# Patient Record
Sex: Female | Born: 1940 | Race: White | Hispanic: No | Marital: Married | Smoking: Former smoker
Health system: Southern US, Community
[De-identification: ages and names within clinical notes are randomized; demographics above are authoritative.]

## PROBLEM LIST (undated history)

## (undated) DIAGNOSIS — R0600 Dyspnea, unspecified: Secondary | ICD-10-CM

## (undated) DIAGNOSIS — I10 Essential (primary) hypertension: Secondary | ICD-10-CM

## (undated) DIAGNOSIS — G4730 Sleep apnea, unspecified: Secondary | ICD-10-CM

## (undated) DIAGNOSIS — M1990 Unspecified osteoarthritis, unspecified site: Secondary | ICD-10-CM

---

## 1999-07-06 ENCOUNTER — Other Ambulatory Visit: Admission: RE | Admit: 1999-07-06 | Discharge: 1999-07-06 | Payer: Self-pay | Admitting: Family Medicine

## 2001-01-10 ENCOUNTER — Other Ambulatory Visit: Admission: RE | Admit: 2001-01-10 | Discharge: 2001-01-10 | Payer: Self-pay | Admitting: Family Medicine

## 2002-11-27 ENCOUNTER — Other Ambulatory Visit: Admission: RE | Admit: 2002-11-27 | Discharge: 2002-11-27 | Payer: Self-pay | Admitting: Family Medicine

## 2004-05-26 ENCOUNTER — Other Ambulatory Visit: Admission: RE | Admit: 2004-05-26 | Discharge: 2004-05-26 | Payer: Self-pay | Admitting: Family Medicine

## 2004-05-26 ENCOUNTER — Ambulatory Visit: Payer: Self-pay | Admitting: Family Medicine

## 2004-06-28 ENCOUNTER — Ambulatory Visit: Payer: Self-pay | Admitting: Family Medicine

## 2004-07-11 ENCOUNTER — Ambulatory Visit: Payer: Self-pay | Admitting: Family Medicine

## 2004-09-20 ENCOUNTER — Ambulatory Visit: Payer: Self-pay | Admitting: Family Medicine

## 2012-10-21 ENCOUNTER — Encounter: Payer: Self-pay | Admitting: Internal Medicine

## 2013-05-30 ENCOUNTER — Encounter: Payer: Self-pay | Admitting: Internal Medicine

## 2019-07-22 ENCOUNTER — Other Ambulatory Visit (HOSPITAL_COMMUNITY): Payer: Self-pay | Admitting: Surgery

## 2019-07-22 ENCOUNTER — Other Ambulatory Visit: Payer: Self-pay | Admitting: Surgery

## 2019-07-22 DIAGNOSIS — E210 Primary hyperparathyroidism: Secondary | ICD-10-CM

## 2019-07-25 ENCOUNTER — Other Ambulatory Visit: Payer: Self-pay | Admitting: Surgery

## 2019-07-25 ENCOUNTER — Other Ambulatory Visit (HOSPITAL_COMMUNITY): Payer: Self-pay | Admitting: Surgery

## 2019-07-25 DIAGNOSIS — E210 Primary hyperparathyroidism: Secondary | ICD-10-CM

## 2019-08-08 ENCOUNTER — Encounter (HOSPITAL_COMMUNITY)
Admission: RE | Admit: 2019-08-08 | Discharge: 2019-08-08 | Disposition: A | Discharge status: Home / Self Care | Payer: Medicare Other | Source: Ambulatory Visit | Attending: Surgery | Admitting: Surgery

## 2019-08-08 ENCOUNTER — Ambulatory Visit (HOSPITAL_COMMUNITY)
Admission: RE | Admit: 2019-08-08 | Discharge: 2019-08-08 | Disposition: A | Discharge status: Home / Self Care | Payer: Medicare Other | Source: Ambulatory Visit | Attending: Surgery | Admitting: Surgery

## 2019-08-08 ENCOUNTER — Other Ambulatory Visit: Payer: Self-pay

## 2019-08-08 DIAGNOSIS — E210 Primary hyperparathyroidism: Secondary | ICD-10-CM | POA: Diagnosis present

## 2019-08-08 MED ORDER — TECHNETIUM TC 99M SESTAMIBI - CARDIOLITE: 24.8000 | Freq: Once | INTRAVENOUS | Status: DC | PRN

## 2019-09-19 ENCOUNTER — Other Ambulatory Visit: Payer: Self-pay | Admitting: Surgery

## 2019-09-19 DIAGNOSIS — E210 Primary hyperparathyroidism: Secondary | ICD-10-CM

## 2019-10-03 ENCOUNTER — Ambulatory Visit
Admission: RE | Admit: 2019-10-03 | Discharge: 2019-10-03 | Disposition: A | Discharge status: Home / Self Care | Payer: Medicare Other | Source: Ambulatory Visit | Attending: Surgery | Admitting: Surgery

## 2019-10-03 DIAGNOSIS — E210 Primary hyperparathyroidism: Secondary | ICD-10-CM

## 2019-10-03 MED ORDER — IOPAMIDOL (ISOVUE-300) INJECTION 61%
75.0000 mL | Freq: Once | INTRAVENOUS | Status: AC | PRN
  Administered 2019-10-03: 75 mL via INTRAVENOUS

## 2019-10-09 ENCOUNTER — Ambulatory Visit: Payer: Self-pay | Admitting: Surgery

## 2019-11-24 ENCOUNTER — Encounter: Payer: Self-pay | Admitting: Surgery

## 2019-11-24 DIAGNOSIS — E210 Primary hyperparathyroidism: Secondary | ICD-10-CM | POA: Diagnosis present

## 2019-11-24 NOTE — H&P (Signed)
General Surgery [facility]  Destiny Patterson Reinig DOB: 1940/11/06 Married / Language: English / Race: White Female   History of Present Illness   The patient is a 79 year old female who presents with primary hyperparathyroidism.  CHIEF COMPLAINT: primary hyperparathyroidism  Patient is referred by Dr. Ginger Organ from [facility] family physicians in [place], [place]. Patient is referred for primary hyperparathyroidism. Patient now lives at [facility]. She previously lived here in [place]. Her evaluation for primary hyperparathyroidism and for thyroid nodules has been performed at [facility] in [facility] as well as at medical [facility] in [place], [place]. Patient brings a variety of records today for review. Patient has likely had hypercalcemia dating back almost 10 years. Most recent laboratory studies show calcium levels ranging from 10.7-10.8 and an intact PTH level measuring from 86-124. Patient notes chronic fatigue. She does have bone and joint discomfort. She has osteoporosis by bone density scanning. She denies nephrolithiasis. She has not had any recent fractures. Patient has had no prior head or neck surgery. Part of her evaluation included thyroid ultrasound for which she underwent fine-needle aspiration biopsies at other facilities. No sign of malignancy was identified. There is no family history of endocrine neoplasm or malignancy. Patient presents today accompanied by her husband for further evaluation and recommendations.   Past Surgical History  Tonsillectomy   Diagnostic Studies History  Colonoscopy  1-5 years ago Mammogram  1-3 years ago  Allergies No Known Drug Allergies  Medication History  amLODIPine Besylate (5MG  Tablet, Oral) Active. hydroCHLOROthiazide (12.5MG  Capsule, Oral) Active. Valsartan (160MG  Tablet, Oral) Active. Calcium Carbonate-Vitamin  D (500-125MG -UNIT Tablet, Oral) Active. Medications Reconciled  Social History Alcohol use  Occasional alcohol use. Caffeine use  Carbonated beverages, Coffee. Tobacco use  Former smoker.  Family History Arthritis  Father, Mother. Hypertension  Mother. Migraine Headache  Daughter, Mother. Thyroid problems  Sister.  Pregnancy / Birth History  Age at menarche  14 years. Age of men[age]  [age] Contraceptive History  Oral contraceptives. Gravida  2  Other Problems  Arthritis  High blood pressure  Sleep Apnea   Review of Systems General Present- Fatigue. Not Present- Appetite Loss, Chills, Fever, Night Sweats, Weight Gain and Weight Loss. Skin Present- Change in Wart/Mole and Dryness. Not Present- Hives, Jaundice, New Lesions, Non-Healing Wounds, Rash and Ulcer. HEENT Present- Seasonal Allergies. Not Present- Earache, Hearing Loss, Hoarseness, Nose Bleed, Oral Ulcers, Ringing in the Ears, Sinus Pain, Sore Throat, Visual Disturbances, Wears glasses/contact lenses and Yellow Eyes.  Vitals  Weight: 214.4 lb Height: 64in Body Surface Area: 2.01 m Body Mass Index: 36.8 kg/m  Temp.: 98.30F  Pulse: 105 (Regular)  BP: 154/84(Sitting, Left Arm, Standard)  Physical Exam  GENERAL APPEARANCE Development: normal Nutritional status: normal Gross deformities: none  SKIN Rash, lesions, ulcers: none Induration, erythema: none Nodules: none palpable  EYES Conjunctiva and lids: normal Pupils: equal and reactive Iris: normal bilaterally  EARS, NOSE, MOUTH, THROAT External ears: no lesion or deformity External nose: no lesion or deformity Hearing: grossly normal Due to Covid-19 pandemic, patient is wearing a mask.  NECK Symmetric: yes Trachea: midline Thyroid: no palpable nodules in the thyroid bed  CHEST Respiratory effort: normal Retraction or accessory muscle use: no Breath sounds: normal bilaterally Rales, rhonchi, wheeze:  none  CARDIOVASCULAR Auscultation: regular rhythm, normal rate Murmurs: none Pulses: radial pulse 2+ palpable Lower extremity edema: Mild bilateral ankles, greater on the left than on the right  MUSCULOSKELETAL Station and gait: normal Digits and nails: no clubbing or cyanosis Muscle strength: grossly normal all extremities Range of motion: grossly normal all extremities Deformity: none  LYMPHATIC Cervical: none palpable Supraclavicular: none palpable  PSYCHIATRIC Oriented to person, place, and time: yes Mood and affect: normal for situation Judgment and insight: appropriate for situation    Assessment & Plan   PRIMARY HYPERPARATHYROIDISM (E21.0) OSTEOPENIA (M85.80)  Patient presents today on referral from her primary care physician, Dr. Ginger Organ, in [place], [place]. She is accompanied by her husband.  Patient provided with a copy of "Parathyroid Surgery: Treatment for Your Parathyroid Gland Problem", published by [employer], 12 pages. Book reviewed and explained to patient during visit today.  Patient has biochemical evidence of primary hyperparathyroidism. She also has a history of thyroid nodules and has undergone previous fine-needle aspiration biopsies. After discussion, I would like to repeat the patient's imaging studies here with our radiologist. I would like to have an ultrasound of the neck performed as well as a nuclear medicine parathyroid scan. Likely these studies will confirm her diagnosis and hopefully provide [place] with a guide for planning her surgery. She most likely has single gland disease and may be a good candidate for minimally invasive outpatient parathyroid surgery. Likely her thyroid nodules will be of the type that continued to be monitored with ultrasound evaluation. If the above studies do not resolve the issue with her parathyroid disease, then we will consider proceeding with a 4D CT scan of the neck.  Patient was undergoing the above  studies. We will contact her with those results and make plans for further management at that time.  ADDENDUM  Telephone call to patient to discuss the results of her CT scan of the neck performed on October 06, 2019. 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  The patient and I discussed minimally invasive parathyroidectomy. This would be an outpatient surgical procedure. We discussed the hospital stay and her recovery. She understands and wishes to proceed in the near future.  The risks and benefits of the procedure have been discussed at length with the patient. The patient understands the proposed procedure, potential alternative treatments, and the course of recovery to be expected. All of the patient's questions have been answered at this time. The patient wishes to proceed with surgery.  Darnell Level, MD [facility] Office: [phone]

## 2019-11-26 NOTE — Patient Instructions (Addendum)
DUE TO COVID-19 ONLY ONE VISITOR IS ALLOWED TO COME WITH YOU AND STAY IN THE WAITING ROOM ONLY DURING PRE OP AND PROCEDURE DAY OF SURGERY. THE 1 VISITOR  MAY VISIT WITH YOU AFTER SURGERY IN YOUR PRIVATE ROOM DURING VISITING HOURS ONLY!  YOU NEED TO HAVE A COVID 19 TEST ON: 11/27/19 ,THIS TEST MUST BE DONE BEFORE SURGERY,  COVID TESTING SITE [address] [place] [address], IT IS ON THE RIGHT GOING OUT [address] APPROXIMATELY  2 MINUTES PAST [employer] ON THE RIGHT. ONCE YOUR COVID TEST IS COMPLETED,  PLEASE BEGIN THE QUARANTINE INSTRUCTIONS AS OUTLINED IN YOUR HANDOUT.                Destiny Patterson    Your procedure is scheduled on: 12/01/19   Report to [facility] Main  Entrance   Report to admitting at: 8:15 AM     Call this number if you have problems the morning of surgery [phone]    Remember: Do not eat food or drink liquids :After Midnight.   BRUSH YOUR TEETH MORNING OF SURGERY AND RINSE YOUR MOUTH OUT, NO CHEWING GUM CANDY OR MINTS.     Take these medicines the morning of surgery with A SIP OF WATER: amlodipine.                                You may not have any metal on your body including hair pins and              piercings  Do not wear jewelry, make-up, lotions, powders or perfumes, deodorant             Do not wear nail polish on your fingernails.  Do not shave  48 hours prior to surgery.    Do not bring valuables to the hospital. [facility] IS NOT             RESPONSIBLE   FOR VALUABLES.  Contacts, dentures or bridgework may not be worn into surgery.  Leave suitcase in the car. After surgery it may be brought to your room.     Patients discharged the day of surgery will not be allowed to drive home. IF YOU ARE HAVING SURGERY AND GOING HOME THE SAME DAY, YOU MUST HAVE AN ADULT TO DRIVE YOU HOME AND BE WITH YOU FOR 24 HOURS. YOU MAY GO HOME BY TAXI OR UBER OR ORTHERWISE, BUT AN ADULT MUST ACCOMPANY YOU HOME AND STAY WITH YOU FOR 24  HOURS.  Name and phone number of your driver:  Special Instructions: N/A              Please read over the following fact sheets you were given: _____________________________________________________________________       PLEASE BRING CPAP MASK AND  TUBING ONLY. DEVICE WILL BE PROVIDED!  [facility] - Preparing for Surgery Before surgery, you can play an important role.  Because skin is not sterile, your skin needs to be as free of germs as possible.  You can reduce the number of germs on your skin by washing with CHG (chlorahexidine gluconate) soap before surgery.  CHG is an antiseptic cleaner which kills germs and bonds with the skin to continue killing germs even after washing. Please DO NOT use if you have an allergy to CHG or antibacterial soaps.  If your skin becomes reddened/irritated stop using the CHG and inform your  nurse when you arrive at Short Stay. Do not shave (including legs and underarms) for at least 48 hours prior to the first CHG shower.  You may shave your face/neck. Please follow these instructions carefully:  1.  Shower with CHG Soap the night before surgery and the  morning of Surgery.  2.  If you choose to wash your hair, wash your hair first as usual with your  normal  shampoo.  3.  After you shampoo, rinse your hair and body thoroughly to remove the  shampoo.                           4.  Use CHG as you would any other liquid soap.  You can apply chg directly  to the skin and wash                       Gently with a scrungie or clean washcloth.  5.  Apply the CHG Soap to your body ONLY FROM THE NECK DOWN.   Do not use on face/ open                           Wound or open sores. Avoid contact with eyes, ears mouth and genitals (private parts).                       Wash face,  Genitals (private parts) with your normal soap.             6.  Wash thoroughly, paying special attention to the area where your surgery  will be performed.  7.  Thoroughly rinse your body with  warm water from the neck down.  8.  DO NOT shower/wash with your normal soap after using and rinsing off  the CHG Soap.                9.  Pat yourself dry with a clean towel.            10.  Wear clean pajamas.            11.  Place clean sheets on your bed the night of your first shower and do not  sleep with pets. Day of Surgery : Do not apply any lotions/deodorants the morning of surgery.  Please wear clean clothes to the hospital/surgery center.  FAILURE TO FOLLOW THESE INSTRUCTIONS MAY RESULT IN THE CANCELLATION OF YOUR SURGERY PATIENT SIGNATURE_________________________________  NURSE SIGNATURE__________________________________  ________________________________________________________________________

## 2019-11-27 ENCOUNTER — Encounter (HOSPITAL_COMMUNITY)
Admission: RE | Admit: 2019-11-27 | Discharge: 2019-11-27 | Disposition: A | Discharge status: Home / Self Care | Payer: Medicare Other | Source: Ambulatory Visit | Attending: Surgery | Admitting: Surgery

## 2019-11-27 ENCOUNTER — Other Ambulatory Visit: Payer: Self-pay

## 2019-11-27 ENCOUNTER — Encounter (HOSPITAL_COMMUNITY): Payer: Self-pay

## 2019-11-27 ENCOUNTER — Other Ambulatory Visit (HOSPITAL_COMMUNITY)
Admission: RE | Admit: 2019-11-27 | Discharge: 2019-11-27 | Disposition: A | Discharge status: Home / Self Care | Payer: Medicare Other | Source: Ambulatory Visit | Attending: Surgery | Admitting: Surgery

## 2019-11-27 DIAGNOSIS — Z20822 Contact with and (suspected) exposure to covid-19: Secondary | ICD-10-CM | POA: Insufficient documentation

## 2019-11-27 DIAGNOSIS — Z01810 Encounter for preprocedural cardiovascular examination: Secondary | ICD-10-CM | POA: Insufficient documentation

## 2019-11-27 DIAGNOSIS — Z01812 Encounter for preprocedural laboratory examination: Secondary | ICD-10-CM | POA: Insufficient documentation

## 2019-11-27 HISTORY — DX: Sleep apnea, unspecified: G47.30

## 2019-11-27 HISTORY — DX: Essential (primary) hypertension: I10

## 2019-11-27 HISTORY — DX: Dyspnea, unspecified: R06.00

## 2019-11-27 HISTORY — DX: Unspecified osteoarthritis, unspecified site: M19.90

## 2019-11-27 LAB — CBC
HCT: 41.2 % (ref 36.0–46.0)
Hemoglobin: 13.5 g/dL (ref 12.0–15.0)
MCH: 30.8 pg (ref 26.0–34.0)
MCHC: 32.8 g/dL (ref 30.0–36.0)
MCV: 94.1 fL (ref 80.0–100.0)
Platelets: 297 10*3/uL (ref 150–400)
RBC: 4.38 MIL/uL (ref 3.87–5.11)
RDW: 13.7 % (ref 11.5–15.5)
WBC: 7.7 10*3/uL (ref 4.0–10.5)
nRBC: 0 % (ref 0.0–0.2)

## 2019-11-27 LAB — BASIC METABOLIC PANEL
Anion gap: 10 (ref 5–15)
BUN: 19 mg/dL (ref 8–23)
CO2: 26 mmol/L (ref 22–32)
Calcium: 10.6 mg/dL — ABNORMAL HIGH (ref 8.9–10.3)
Chloride: 105 mmol/L (ref 98–111)
Creatinine, Ser: 0.82 mg/dL (ref 0.44–1.00)
GFR calc Af Amer: >60 mL/min (ref >60)
GFR calc non Af Amer: >60 mL/min (ref >60)
Glucose, Bld: 115 mg/dL — ABNORMAL HIGH (ref 70–99)
Potassium: 4.1 mmol/L (ref 3.5–5.1)
Sodium: 141 mmol/L (ref 135–145)

## 2019-11-27 LAB — SARS CORONAVIRUS 2 (TAT 6-24 HRS): SARS Coronavirus 2: NEGATIVE

## 2019-11-27 NOTE — Progress Notes (Signed)
COVID Vaccine Completed:Yes Date COVID Vaccine completed:05/23/19 COVID vaccine manufacturer:     Moderna    PCP - Dr. Donita Brooks. LOV: 07/28/19. Cardiologist - NO  Chest x-ray -  EKG -  Stress Test -  ECHO -  Cardiac Cath -  Pacemaker/ICD device last checked:  Sleep Study - Yes CPAP - Yes  Fasting Blood Sugar -  Checks Blood Sugar _____ times a day  Blood Thinner Instructions: Aspirin Instructions: Last Dose:  Anesthesia review:   Patient denies shortness of breath, fever, cough and chest pain at PAT appointment   Patient verbalized understanding of instructions that were given to them at the PAT appointment. Patient was also instructed that they will need to review over the PAT instructions again at home before surgery.

## 2019-12-01 ENCOUNTER — Ambulatory Visit (HOSPITAL_COMMUNITY)
Admission: RE | Admit: 2019-12-01 | Discharge: 2019-12-01 | Disposition: A | Discharge status: Home / Self Care | Payer: Medicare Other | Attending: Surgery | Admitting: Surgery

## 2019-12-01 ENCOUNTER — Ambulatory Visit (HOSPITAL_COMMUNITY): Payer: Medicare Other | Admitting: Anesthesiology

## 2019-12-01 ENCOUNTER — Encounter (HOSPITAL_COMMUNITY): Payer: Self-pay | Admitting: Surgery

## 2019-12-01 ENCOUNTER — Encounter (HOSPITAL_COMMUNITY)
Admission: RE | Disposition: A | Discharge status: Home / Self Care | Payer: Self-pay | Source: Home / Self Care | Attending: Surgery

## 2019-12-01 DIAGNOSIS — Z79899 Other long term (current) drug therapy: Secondary | ICD-10-CM | POA: Diagnosis not present

## 2019-12-01 DIAGNOSIS — I10 Essential (primary) hypertension: Secondary | ICD-10-CM | POA: Diagnosis not present

## 2019-12-01 DIAGNOSIS — E669 Obesity, unspecified: Secondary | ICD-10-CM | POA: Diagnosis not present

## 2019-12-01 DIAGNOSIS — Z87891 Personal history of nicotine dependence: Secondary | ICD-10-CM | POA: Insufficient documentation

## 2019-12-01 DIAGNOSIS — M1990 Unspecified osteoarthritis, unspecified site: Secondary | ICD-10-CM | POA: Insufficient documentation

## 2019-12-01 DIAGNOSIS — E210 Primary hyperparathyroidism: Secondary | ICD-10-CM | POA: Insufficient documentation

## 2019-12-01 DIAGNOSIS — G4730 Sleep apnea, unspecified: Secondary | ICD-10-CM | POA: Insufficient documentation

## 2019-12-01 DIAGNOSIS — Z6836 Body mass index (BMI) 36.0-36.9, adult: Secondary | ICD-10-CM | POA: Insufficient documentation

## 2019-12-01 HISTORY — PX: PARATHYROIDECTOMY: SHX19

## 2019-12-01 SURGERY — PARATHYROIDECTOMY
Anesthesia: General | Site: Neck

## 2019-12-01 MED ORDER — ONDANSETRON HCL 4 MG/2ML IJ SOLN
INTRAMUSCULAR | Status: AC
  Filled 2019-12-01: qty 2

## 2019-12-01 MED ORDER — CHLORHEXIDINE GLUCONATE CLOTH 2 % EX PADS: 6.0000 | MEDICATED_PAD | Freq: Once | CUTANEOUS | Status: DC

## 2019-12-01 MED ORDER — LIDOCAINE 2% (20 MG/ML) 5 ML SYRINGE
INTRAMUSCULAR | Status: DC | PRN
  Administered 2019-12-01: 80 mg via INTRAVENOUS

## 2019-12-01 MED ORDER — CHLORHEXIDINE GLUCONATE 0.12 % MT SOLN
15.0000 mL | Freq: Once | OROMUCOSAL | Status: AC
  Administered 2019-12-01: 15 mL via OROMUCOSAL

## 2019-12-01 MED ORDER — BUPIVACAINE HCL 0.25 % IJ SOLN
INTRAMUSCULAR | Status: AC
  Filled 2019-12-01: qty 1

## 2019-12-01 MED ORDER — OXYCODONE HCL 5 MG/5ML PO SOLN: 5.0000 mg | Freq: Once | ORAL | Status: DC | PRN

## 2019-12-01 MED ORDER — LACTATED RINGERS IV SOLN: INTRAVENOUS | Status: DC

## 2019-12-01 MED ORDER — EPHEDRINE SULFATE 50 MG/ML IJ SOLN
INTRAMUSCULAR | Status: DC | PRN
  Administered 2019-12-01 (×3): 10 mg via INTRAVENOUS

## 2019-12-01 MED ORDER — ROCURONIUM BROMIDE 10 MG/ML (PF) SYRINGE
PREFILLED_SYRINGE | INTRAVENOUS | Status: DC | PRN
  Administered 2019-12-01: 50 mg via INTRAVENOUS
  Administered 2019-12-01: 10 mg via INTRAVENOUS

## 2019-12-01 MED ORDER — ONDANSETRON HCL 4 MG/2ML IJ SOLN: 4.0000 mg | Freq: Once | INTRAMUSCULAR | Status: DC | PRN

## 2019-12-01 MED ORDER — BUPIVACAINE-EPINEPHRINE (PF) 0.25% -1:200000 IJ SOLN
INTRAMUSCULAR | Status: AC
  Filled 2019-12-01: qty 30

## 2019-12-01 MED ORDER — FENTANYL CITRATE (PF) 100 MCG/2ML IJ SOLN
INTRAMUSCULAR | Status: DC | PRN
Start: 2019-12-01 — End: 2019-12-01
  Administered 2019-12-01: 50 ug via INTRAVENOUS
  Administered 2019-12-01 (×2): 25 ug via INTRAVENOUS
  Administered 2019-12-01: 50 ug via INTRAVENOUS

## 2019-12-01 MED ORDER — DEXAMETHASONE SODIUM PHOSPHATE 4 MG/ML IJ SOLN
INTRAMUSCULAR | Status: DC | PRN
  Administered 2019-12-01: 8 mg via INTRAVENOUS

## 2019-12-01 MED ORDER — OXYCODONE HCL 5 MG PO TABS: 5.0000 mg | ORAL_TABLET | Freq: Once | ORAL | Status: DC | PRN

## 2019-12-01 MED ORDER — PHENYLEPHRINE 40 MCG/ML (10ML) SYRINGE FOR IV PUSH (FOR BLOOD PRESSURE SUPPORT)
PREFILLED_SYRINGE | INTRAVENOUS | Status: DC | PRN
  Administered 2019-12-01 (×2): 80 ug via INTRAVENOUS
  Administered 2019-12-01 (×2): 120 ug via INTRAVENOUS

## 2019-12-01 MED ORDER — ORAL CARE MOUTH RINSE: 15.0000 mL | Freq: Once | OROMUCOSAL | Status: AC

## 2019-12-01 MED ORDER — CEFAZOLIN SODIUM-DEXTROSE 2-4 GM/100ML-% IV SOLN
2.0000 g | INTRAVENOUS | Status: AC
  Administered 2019-12-01: 2 g via INTRAVENOUS
  Filled 2019-12-01: qty 100

## 2019-12-01 MED ORDER — SUGAMMADEX SODIUM 200 MG/2ML IV SOLN
INTRAVENOUS | Status: DC | PRN
  Administered 2019-12-01: 200 mg via INTRAVENOUS

## 2019-12-01 MED ORDER — FENTANYL CITRATE (PF) 100 MCG/2ML IJ SOLN: 25.0000 ug | INTRAMUSCULAR | Status: DC | PRN

## 2019-12-01 MED ORDER — BUPIVACAINE HCL 0.25 % IJ SOLN
INTRAMUSCULAR | Status: DC | PRN
  Administered 2019-12-01: 10 mL

## 2019-12-01 MED ORDER — FENTANYL CITRATE (PF) 100 MCG/2ML IJ SOLN
INTRAMUSCULAR | Status: AC
  Filled 2019-12-01: qty 2

## 2019-12-01 MED ORDER — DIPHENHYDRAMINE HCL 50 MG/ML IJ SOLN
INTRAMUSCULAR | Status: DC | PRN
  Administered 2019-12-01: 12.5 mg via INTRAVENOUS

## 2019-12-01 MED ORDER — TRAMADOL HCL 50 MG PO TABS: 50.0000 mg | ORAL_TABLET | Freq: Four times a day (QID) | ORAL | 0 refills | Status: AC | PRN

## 2019-12-01 MED ORDER — PROPOFOL 10 MG/ML IV BOLUS
INTRAVENOUS | Status: AC
  Filled 2019-12-01: qty 20

## 2019-12-01 SURGICAL SUPPLY — 31 items
ADH SKN CLS APL DERMABOND .7 (GAUZE/BANDAGES/DRESSINGS) ×1
APL PRP STRL LF DISP 70% ISPRP (MISCELLANEOUS) ×1
ATTRACTOMAT 16X20 MAGNETIC DRP (DRAPES) ×2 IMPLANT
BLADE SURG 15 STRL LF DISP TIS (BLADE) ×1 IMPLANT
BLADE SURG 15 STRL SS (BLADE) ×2
CHLORAPREP W/TINT 26 (MISCELLANEOUS) ×2 IMPLANT
CLIP VESOCCLUDE MED 6/CT (CLIP) ×4 IMPLANT
CLIP VESOCCLUDE SM WIDE 6/CT (CLIP) ×4 IMPLANT
COVER SURGICAL LIGHT HANDLE (MISCELLANEOUS) ×2 IMPLANT
COVER WAND RF STERILE (DRAPES) ×2 IMPLANT
DERMABOND ADVANCED (GAUZE/BANDAGES/DRESSINGS) ×1
DERMABOND ADVANCED .7 DNX12 (GAUZE/BANDAGES/DRESSINGS) ×1 IMPLANT
DRAPE LAPAROTOMY T 98X78 PEDS (DRAPES) ×2 IMPLANT
ELECT REM PT RETURN 15FT ADLT (MISCELLANEOUS) ×2 IMPLANT
GAUZE 4X4 16PLY RFD (DISPOSABLE) ×2 IMPLANT
GLOVE SURG ORTHO 8.0 STRL STRW (GLOVE) ×2 IMPLANT
GOWN STRL REUS W/TWL XL LVL3 (GOWN DISPOSABLE) ×6 IMPLANT
HEMOSTAT SURGICEL 2X4 FIBR (HEMOSTASIS) ×2 IMPLANT
ILLUMINATOR WAVEGUIDE N/F (MISCELLANEOUS) IMPLANT
KIT BASIN OR (CUSTOM PROCEDURE TRAY) ×2 IMPLANT
KIT TURNOVER KIT A (KITS) IMPLANT
NEEDLE HYPO 25X1 1.5 SAFETY (NEEDLE) ×2 IMPLANT
PACK BASIC VI WITH GOWN DISP (CUSTOM PROCEDURE TRAY) ×2 IMPLANT
PENCIL SMOKE EVACUATOR (MISCELLANEOUS) ×2 IMPLANT
SUT MNCRL AB 4-0 PS2 18 (SUTURE) ×2 IMPLANT
SUT VIC AB 3-0 SH 18 (SUTURE) ×4 IMPLANT
SYR BULB IRRIG 60ML STRL (SYRINGE) ×2 IMPLANT
SYR CONTROL 10ML LL (SYRINGE) ×2 IMPLANT
TOWEL OR 17X26 10 PK STRL BLUE (TOWEL DISPOSABLE) ×2 IMPLANT
TOWEL OR NON WOVEN STRL DISP B (DISPOSABLE) ×2 IMPLANT
TUBING CONNECTING 10 (TUBING) ×2 IMPLANT

## 2019-12-01 NOTE — Anesthesia Preprocedure Evaluation (Addendum)
Anesthesia Evaluation  Patient identified by MRN, date of birth, ID band Patient awake    Reviewed: Allergy & Precautions, NPO status , Patient's Chart, lab work & pertinent test results  History of Anesthesia Complications Negative for: history of anesthetic complications  Airway Mallampati: III  TM Distance: >3 FB Neck ROM: Full    Dental  (+) Dental Advisory Given, Caps, Chipped   Pulmonary sleep apnea and Continuous Positive Airway Pressure Ventilation , former smoker,    Pulmonary exam normal        Cardiovascular hypertension, Pt. on medications Normal cardiovascular exam     Neuro/Psych negative neurological ROS  negative psych ROS   GI/Hepatic Neg liver ROS, GERD  Medicated and Controlled,  Endo/Other   Obesity   Renal/GU negative Renal ROS     Musculoskeletal  (+) Arthritis ,   Abdominal   Peds  Hematology negative hematology ROS (+)   Anesthesia Other Findings Covid test negative   Reproductive/Obstetrics                            Anesthesia Physical Anesthesia Plan  ASA: II  Anesthesia Plan: General   Post-op Pain Management:    Induction: Intravenous  PONV Risk Score and Plan: 3 and Treatment may vary due to age or medical condition and Ondansetron  Airway Management Planned: Oral ETT  Additional Equipment: None  Intra-op Plan:   Post-operative Plan: Extubation in OR  Informed Consent: I have reviewed the patients History and Physical, chart, labs and discussed the procedure including the risks, benefits and alternatives for the proposed anesthesia with the patient or authorized representative who has indicated his/her understanding and acceptance.     Dental advisory given  Plan Discussed with: CRNA and Anesthesiologist  Anesthesia Plan Comments:        Anesthesia Quick Evaluation

## 2019-12-01 NOTE — Op Note (Signed)
OPERATIVE REPORT - PARATHYROIDECTOMY  Preoperative diagnosis: Primary hyperparathyroidism  Postop diagnosis: Same  Procedure: Left superior minimally invasive parathyroidectomy  Surgeon:  Darnell Level, MD  Assistant:  Hedda Slade, PA-C  Anesthesia: General endotracheal  Estimated blood loss: Minimal  Preparation: ChloraPrep  Indications: Patient is referred by Dr. Ginger Organ from [facility] family physicians in [place], [place]. Patient is referred for primary hyperparathyroidism. Patient now lives at [facility]. She previously lived here in [place]. Her evaluation for primary hyperparathyroidism and for thyroid nodules has been performed at [facility] in [facility] as well as at medical [place] [facility] in [place], [place]. Patient brings a variety of records today for review. Patient has likely had hypercalcemia dating back almost 10 years. Most recent laboratory studies show calcium levels ranging from 10.7-10.8 and an intact PTH level measuring from 86-124. Patient notes chronic fatigue. She does have bone and joint discomfort. She has osteoporosis by bone density scanning. She denies nephrolithiasis. She has not had any recent fractures. Patient has had no prior head or neck surgery. 4D-CT scan was performed and localized a left superior adenoma.  Patient now comes to surgery for resection.  Procedure: The patient was prepared in the pre-operative holding area. The patient was brought to the operating room and placed in a supine position on the operating room table. Following administration of general anesthesia, the patient was positioned and then prepped and draped in the usual strict aseptic fashion. After ascertaining that an adequate level of anesthesia been achieved, a neck incision was made with a #15 blade. Dissection was carried through subcutaneous tissues and platysma. Hemostasis was obtained with  the electrocautery. Skin flaps were developed circumferentially and a Weitlander retractor was placed for exposure.  Strap muscles were incised in the midline. Strap muscles were reflected laterally exposing the thyroid lobe. With gentle blunt dissection the thyroid lobe was mobilized.  Dissection was carried through adipose tissue and a mildly enlarged parathyroid gland was identified posterior to the left superior pole.  This matched the description on the CT scan. It was gently mobilized. Vascular structures were divided between small ligaclips. Care was taken to avoid the recurrent laryngeal nerve and the esophagus. The parathyroid gland was completely excised. It was submitted to pathology where frozen section confirmed parathyroid tissue consistent with adenoma.  Neck was irrigated with warm saline and good hemostasis was noted. Fibrillar was placed in the operative field. Strap muscles were approximated in the midline with interrupted 3-0 Vicryl sutures. Platysma was closed with interrupted 3-0 Vicryl sutures. Marcaine was infiltrated circumferentially. Skin was closed with a running 4-0 Monocryl subcuticular suture. Wound was washed and dried and Dermabond was applied. Patient was awakened from anesthesia and brought to the recovery room. The patient tolerated the procedure well.   Darnell Level, MD [facility] Office: [phone]

## 2019-12-01 NOTE — Transfer of Care (Signed)
Immediate Anesthesia Transfer of Care Note  Patient: Sharnelle Cappelli Villalva  Procedure(s) Performed: Procedure(s): LEFT SUPERIOR PARATHYROIDECTOMY (N/A)  Patient Location: PACU  Anesthesia Type:General  Level of Consciousness: Patient easily awoken, sedated, comfortable, cooperative, following commands, responds to stimulation.   Airway & Oxygen Therapy: Patient spontaneously breathing, ventilating well, oxygen via simple oxygen mask.  Post-op Assessment: Report given to PACU RN, vital signs reviewed and stable, moving all extremities.   Post vital signs: Reviewed and stable.  Complications: No apparent anesthesia complications Last Vitals:  Vitals Value Taken Time  BP 161/77 12/01/19 1108  Temp    Pulse 95 12/01/19 1110  Resp 15 12/01/19 1110  SpO2 100 % 12/01/19 1110  Vitals shown include unvalidated device data.  Last Pain:  Vitals:   12/01/19 0845  TempSrc:   PainSc: 0-No pain         Complications: No complications documented.

## 2019-12-01 NOTE — Anesthesia Procedure Notes (Signed)
Procedure Name: Intubation Date/Time: 12/01/2019 10:05 AM Performed by: Deliah Boston, CRNA Pre-anesthesia Checklist: Patient identified, Emergency Drugs available, Suction available and Patient being monitored Patient Re-evaluated:Patient Re-evaluated prior to induction Oxygen Delivery Method: Circle system utilized Preoxygenation: Pre-oxygenation with 100% oxygen Induction Type: IV induction Ventilation: Mask ventilation without difficulty Laryngoscope Size: Mac and 3 Grade View: Grade II Tube type: Oral Tube size: 7.0 mm Number of attempts: 1 Airway Equipment and Method: Stylet and Oral airway Placement Confirmation: ETT inserted through vocal cords under direct vision,  positive ETCO2 and breath sounds checked- equal and bilateral Secured at: 22 cm Tube secured with: Tape Dental Injury: Teeth and Oropharynx as per pre-operative assessment

## 2019-12-01 NOTE — Anesthesia Postprocedure Evaluation (Signed)
Anesthesia Post Note  Patient: Destiny Patterson  Procedure(s) Performed: LEFT SUPERIOR PARATHYROIDECTOMY (N/A Neck)     Patient location during evaluation: PACU Anesthesia Type: General Level of consciousness: awake and alert Pain management: pain level controlled Vital Signs Assessment: post-procedure vital signs reviewed and stable Respiratory status: spontaneous breathing, nonlabored ventilation and respiratory function stable Cardiovascular status: blood pressure returned to baseline and stable Postop Assessment: no apparent nausea or vomiting Anesthetic complications: no   No complications documented.  Last Vitals:  Vitals:   12/01/19 1130 12/01/19 1145  BP: (!) 152/79 (!) 148/78  Pulse: 90 86  Resp: 10 13  Temp:  (!) 36.3 C  SpO2: 95% 96%    Last Pain:  Vitals:   12/01/19 1145  TempSrc:   PainSc: 0-No pain                 Beryle Lathe

## 2019-12-01 NOTE — Interval H&P Note (Signed)
History and Physical Interval Note:  12/01/2019 9:36 AM  Destiny Patterson  has presented today for surgery, with the diagnosis of primary hyperparathyroidism.  The various methods of treatment have been discussed with the patient and family. After consideration of risks, benefits and other options for treatment, the patient has consented to    Procedure(s): LEFT SUPERIOR PARATHYROIDECTOMY (N/A) as a surgical intervention.    The patient's history has been reviewed, patient examined, no change in status, stable for surgery.  I have reviewed the patient's chart and labs.  Questions were answered to the patient's satisfaction.    Darnell Level, MD [facility] Office: [phone]   Darnell Level

## 2019-12-02 ENCOUNTER — Encounter (HOSPITAL_COMMUNITY): Payer: Self-pay | Admitting: Surgery

## 2019-12-02 LAB — SURGICAL PATHOLOGY

## 2020-08-24 ENCOUNTER — Other Ambulatory Visit: Payer: Self-pay | Admitting: Surgery

## 2020-08-24 DIAGNOSIS — E041 Nontoxic single thyroid nodule: Secondary | ICD-10-CM

## 2020-09-29 ENCOUNTER — Ambulatory Visit
Admission: RE | Admit: 2020-09-29 | Discharge: 2020-09-29 | Disposition: A | Discharge status: Home / Self Care | Payer: Medicare Other | Source: Ambulatory Visit | Attending: Surgery | Admitting: Surgery

## 2020-09-29 DIAGNOSIS — E041 Nontoxic single thyroid nodule: Secondary | ICD-10-CM

## 2020-09-30 NOTE — Progress Notes (Signed)
USN on 7/20 shows 1.5 cm right inferior nodule for which biopsy is recommended.  Destiny Patterson - would you contact patient and schedule biopsy with [facility] so that we can have the results at her appointment with me on 11/02/2020?  Darnell Level, MD [facility] Office: [phone]

## 2020-10-07 ENCOUNTER — Other Ambulatory Visit: Payer: Self-pay | Admitting: Surgery

## 2020-10-07 DIAGNOSIS — E041 Nontoxic single thyroid nodule: Secondary | ICD-10-CM

## 2020-10-21 ENCOUNTER — Ambulatory Visit
Admission: RE | Admit: 2020-10-21 | Discharge: 2020-10-21 | Disposition: A | Discharge status: Home / Self Care | Payer: Medicare Other | Source: Ambulatory Visit | Attending: Surgery | Admitting: Surgery

## 2020-10-21 ENCOUNTER — Other Ambulatory Visit: Payer: Self-pay

## 2020-10-21 ENCOUNTER — Other Ambulatory Visit (HOSPITAL_COMMUNITY)
Admission: RE | Admit: 2020-10-21 | Discharge: 2020-10-21 | Disposition: A | Discharge status: Home / Self Care | Payer: Medicare Other | Source: Ambulatory Visit | Attending: Surgery | Admitting: Surgery

## 2020-10-21 DIAGNOSIS — E041 Nontoxic single thyroid nodule: Secondary | ICD-10-CM | POA: Diagnosis present

## 2020-10-24 LAB — CYTOLOGY - NON PAP

## 2022-04-09 IMAGING — CT CT NECK SOFT TISSUE WO/W CM
4 of 8 series · 12 of 33 positions shown, 14 images · IV contrast (APPLIED)
Comparison: Thyroid ultrasound and sestamibi scan 08/08/2019

CLINICAL DATA: Primary hyperparathyroidism. Elevated blood calcium
levels. Thyroid ultrasound and previous sestamibi scans were
negative.

EXAM:
CT NECK WITH AND WITHOUT CONTRAST
TECHNIQUE: Multidetector CT imaging of the neck was performed without and with
intravenous contrast.
CONTRAST:  75mL GKIGW2-ZBB IOPAMIDOL (GKIGW2-ZBB) INJECTION 61%

[Series 4: (id) · axial · 0.41mm/px · z∈[+821,+891]mm · 2 of 211 slices shown, 3 images]
[im 71/211  soft-tissue]
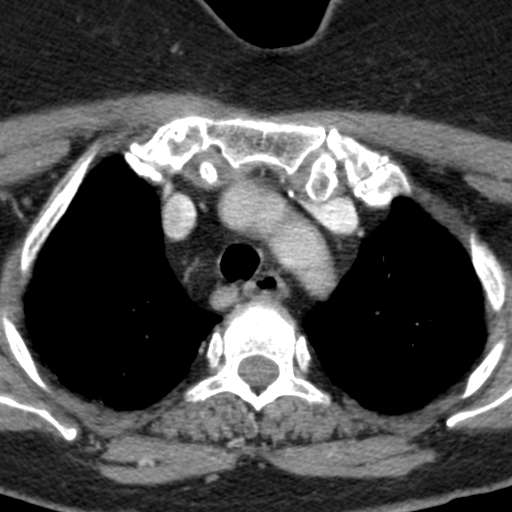
[im 71/211  bone]
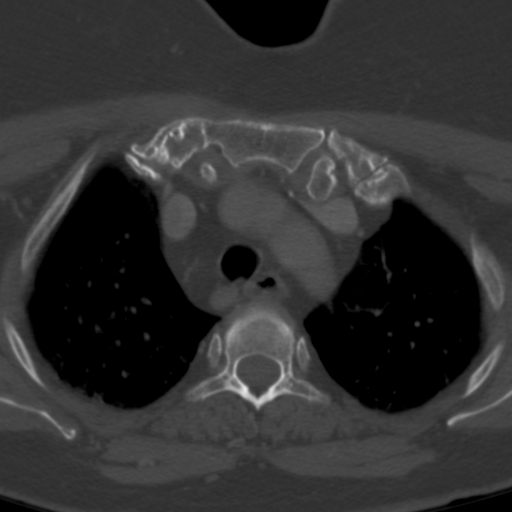
[im 141/211  bone]
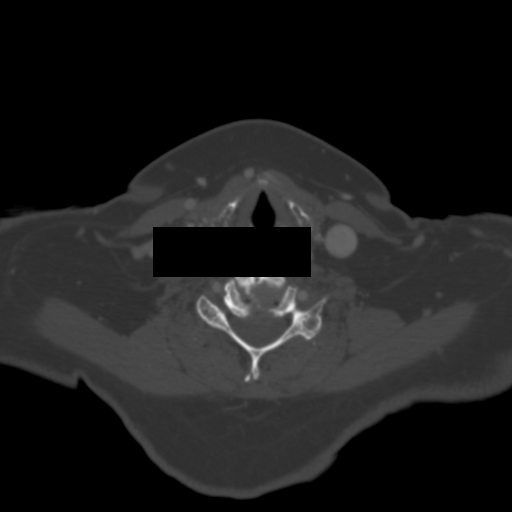

[Series 5: (id) mm · axial · 0.41mm/px · z∈[+821,+891]mm · 2 of 211 slices shown]
[im 71/211  bone]
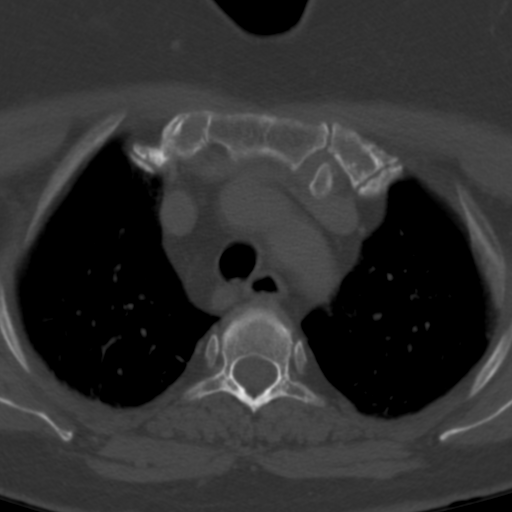
[im 141/211  bone]
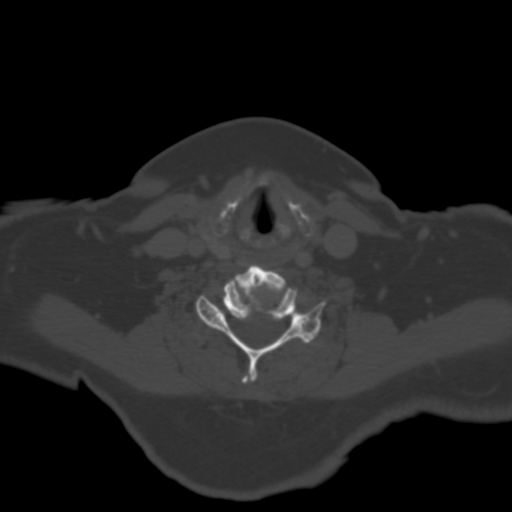

[Series 11: neck-venous-cor 2mm · coronal · portal-venous · 0.42mm/px · 3 of 110 slices shown]
[im 22/110  bone]
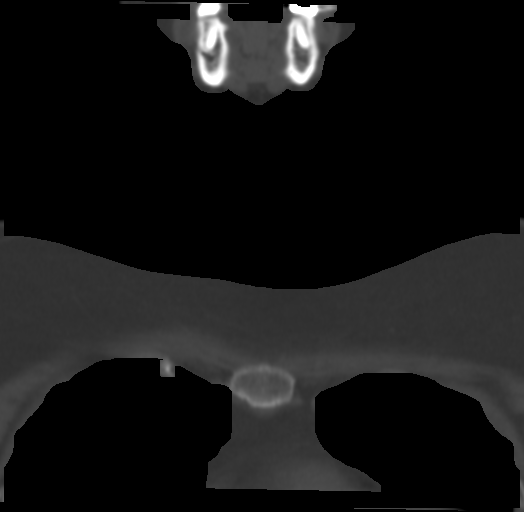
[im 44/110  bone]
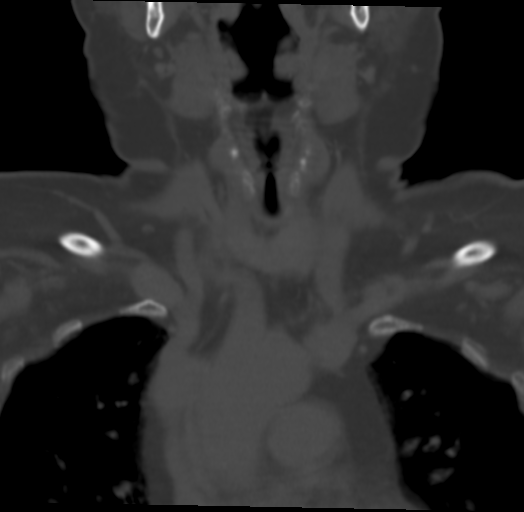
[im 66/110  bone]
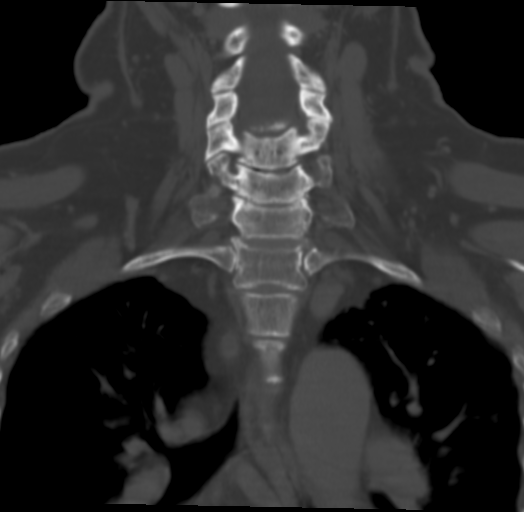

[Series 12: neck-venous-sag 2mm · sagittal · portal-venous · 0.42mm/px · 5 of 111 slices shown, 6 images]
[im 37/111  bone]
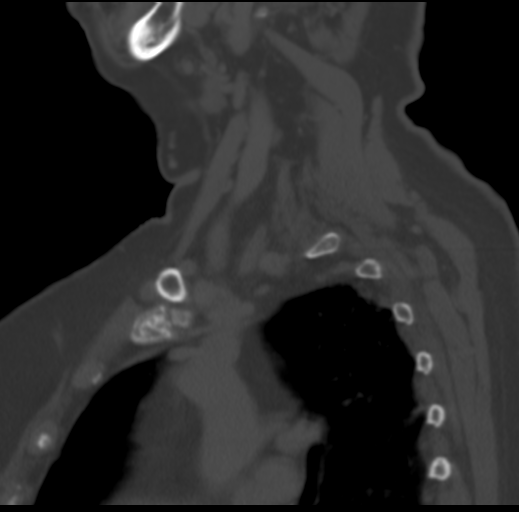
[im 46/111  bone]
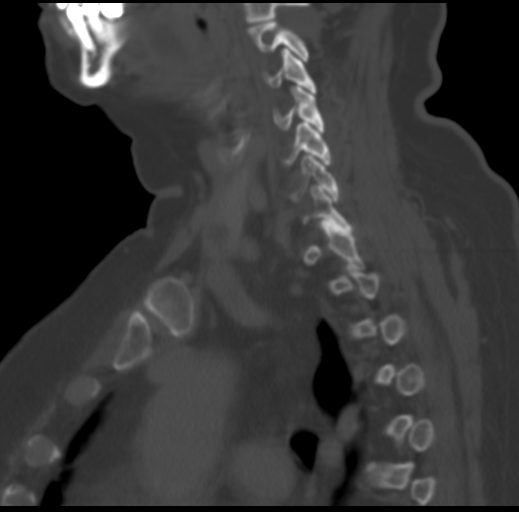
[im 56/111  soft-tissue]
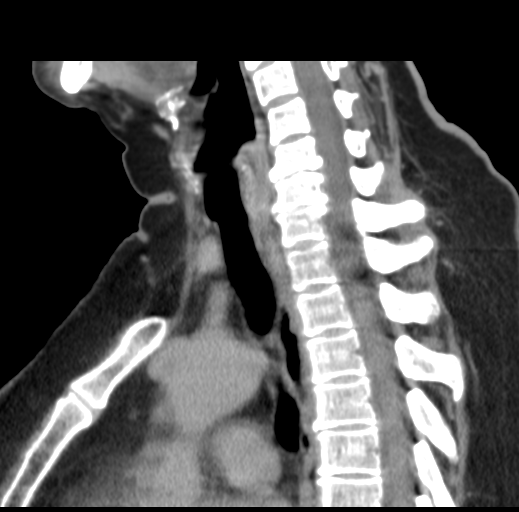
[im 56/111  bone]
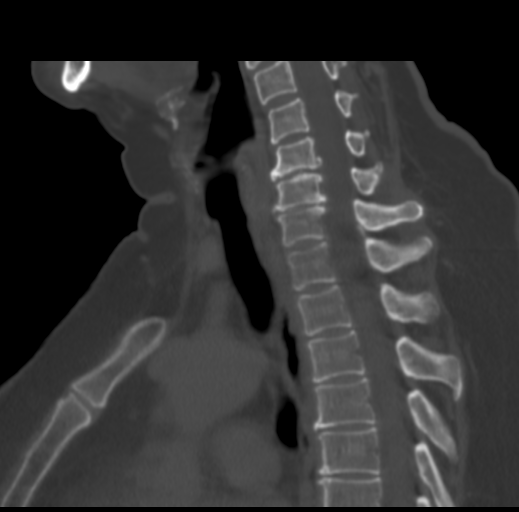
[im 65/111  bone]
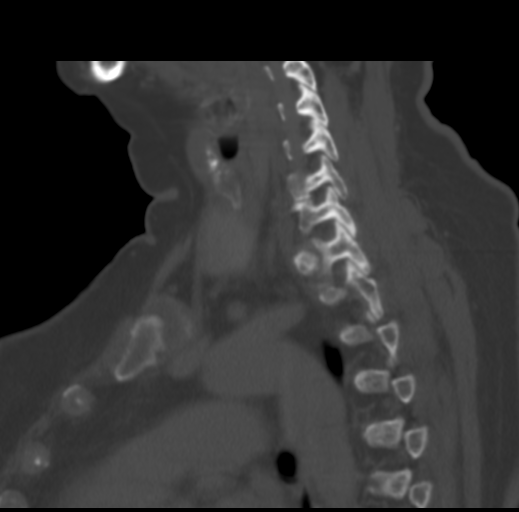
[im 74/111  bone]
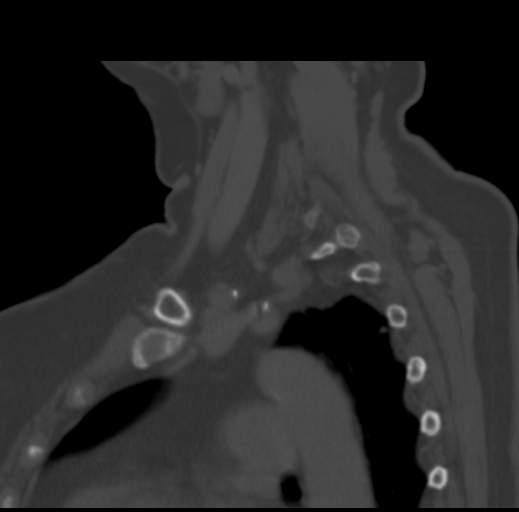

[12 of 33 positions shown; findings below may reference images not displayed]

FINDINGS: Pharynx and larynx: No significant mucosal or submucosal lesions are
present. Oropharynx is clear. Epiglottis is within normal limits.
Hypopharynx is normal. Vocal cords are midline and symmetric.
Trachea is normal.

Salivary glands: Visualized parotid and submandibular glands and
ducts are within normal limits.

Thyroid: Multinodular goiter is again seen. This has been evaluated
by thyroid ultrasound. One year follow-up was recommended.

A distinct 7 x 8 mm nodule is present posterior to the upper pole of
the left lobe of the thyroid. This demonstrates lower density than
the thyroid on non contrast imaging. Similar density on arterial
phase imaging, and earlier washout than thyroid. No other posterior
nodules demonstrate this pattern.

Lymph nodes: No significant cervical adenopathy is present.

Vascular: No significant vascular disease is present.

Skeleton: Degenerative changes the cervical spine most pronounced at
C4-5, C5-6 and C6-7

Upper chest: The lung apices are clear. Thoracic inlet is within
normal limits.
IMPRESSION: 1. 7 x 8 mm nodule posterior to the upper pole of the left lobe of
the thyroid is consistent for a left superior pole parathyroid
adenoma.
2. No significant cervical adenopathy.
3. Multinodular goiter. This has been evaluated by thyroid
ultrasound. One year follow-up was recommended.
4. Degenerative changes of the cervical spine.

## 2023-04-06 IMAGING — US US THYROID
1 series · 13 of 25 positions shown · non-contrast
Comparison: 08/08/2019

CLINICAL DATA: Nodule follow-up

EXAM:
THYROID ULTRASOUND
TECHNIQUE: Ultrasound examination of the thyroid gland and adjacent soft
tissues was performed.

[Series 1: us thyroid · 0.06mm/px · 13 of 37 slices shown]
[im 1/37]
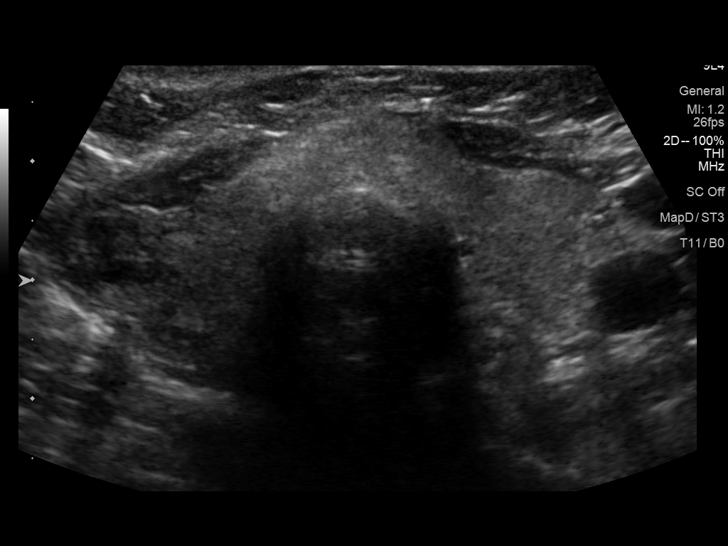
[im 4/37]
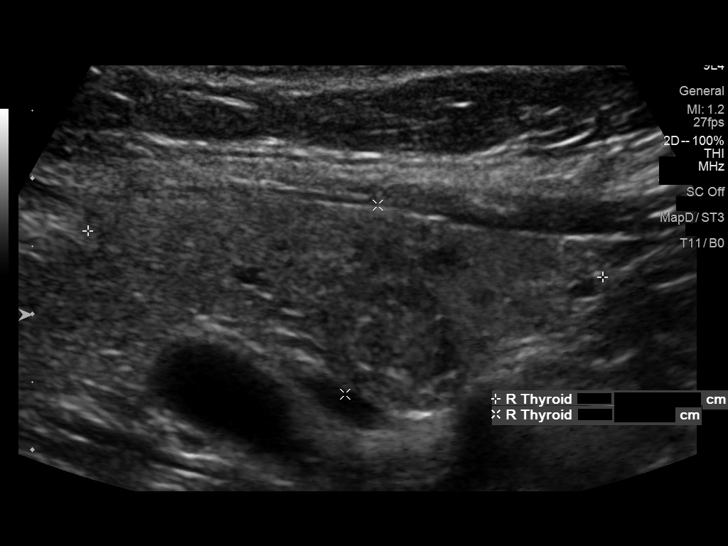
[im 7/37]
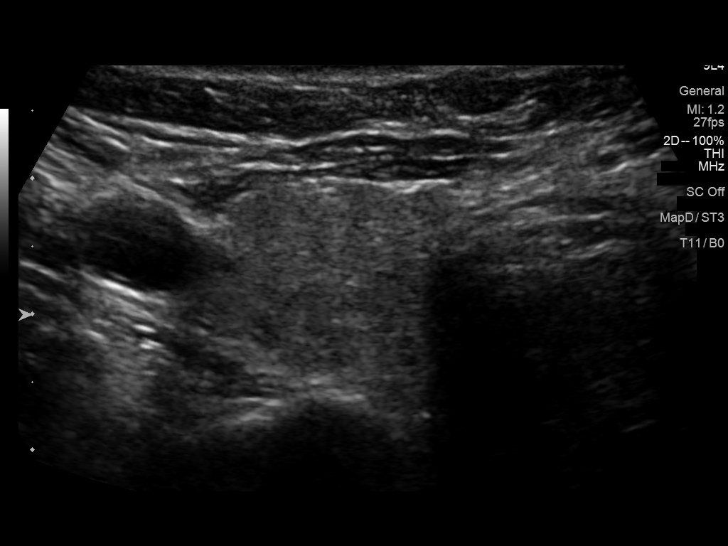
[im 10/37]
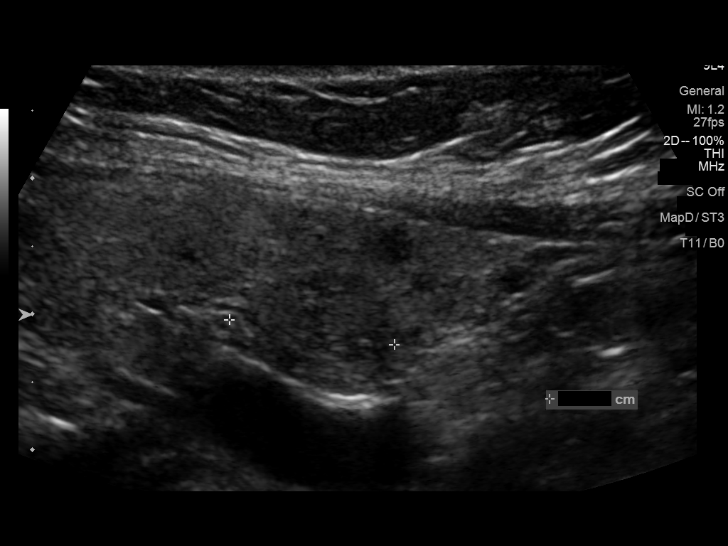
[im 13/37]
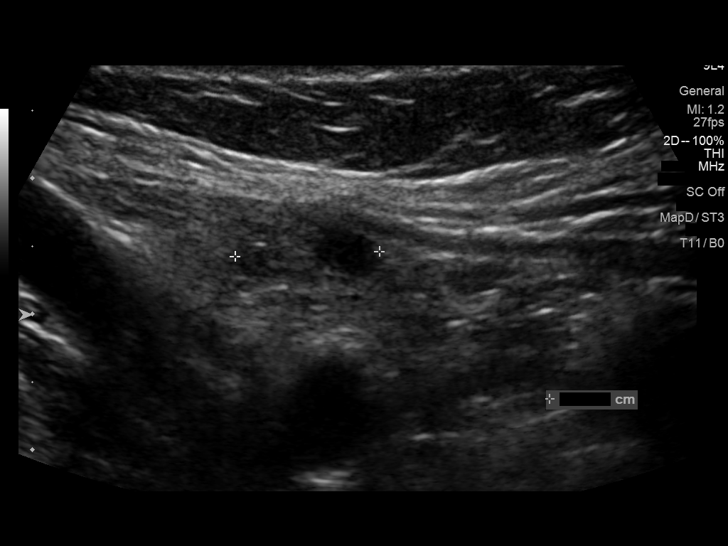
[im 16/37]
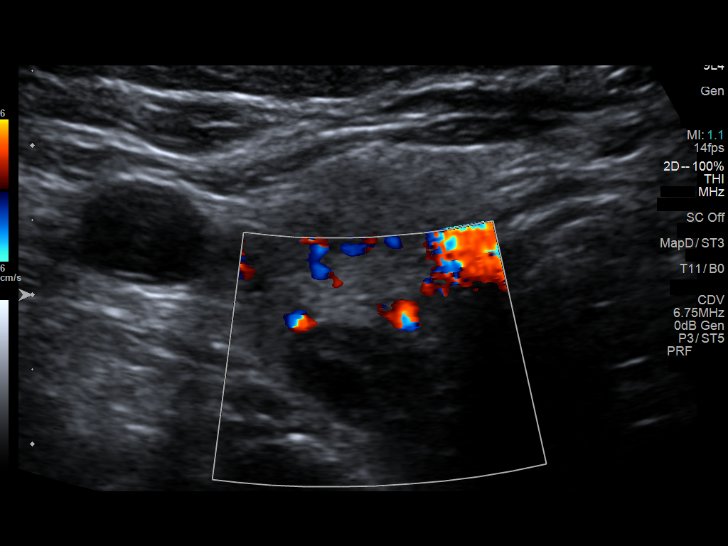
[im 19/37]
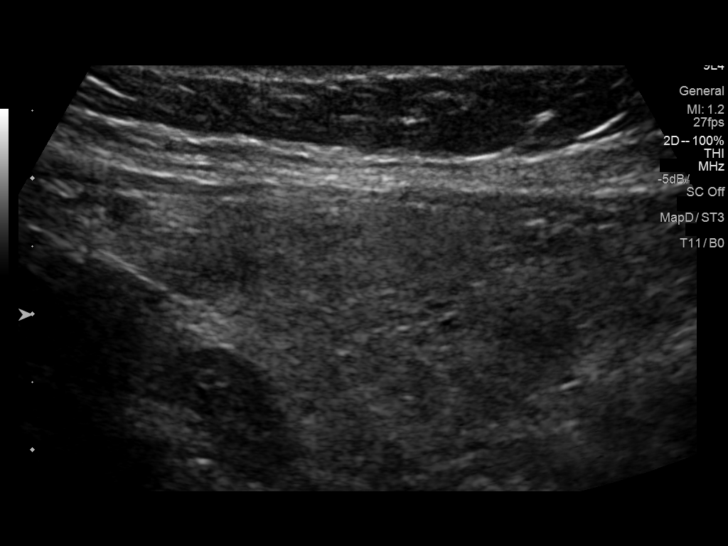
[im 22/37]
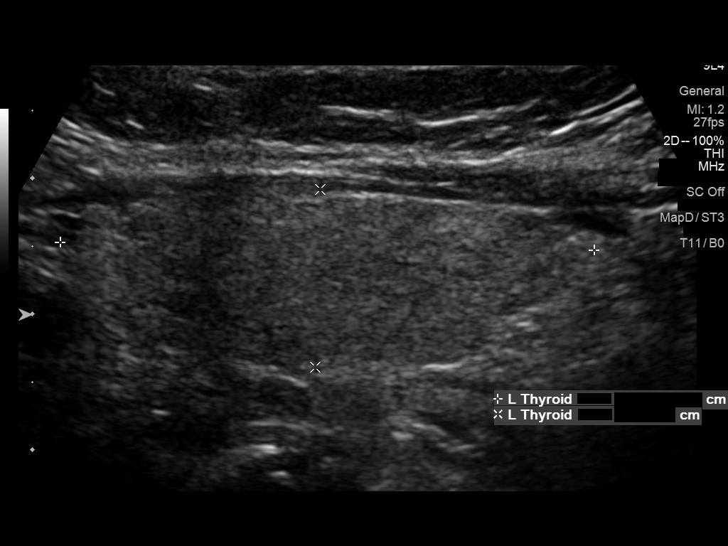
[im 25/37]
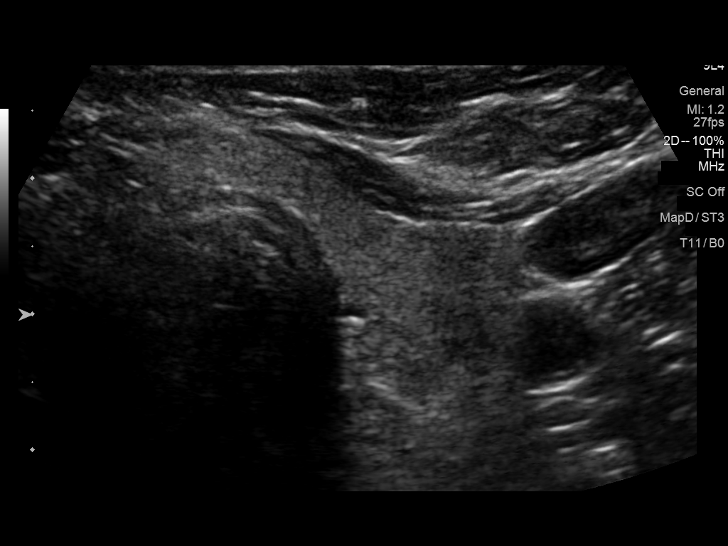
[im 28/37]
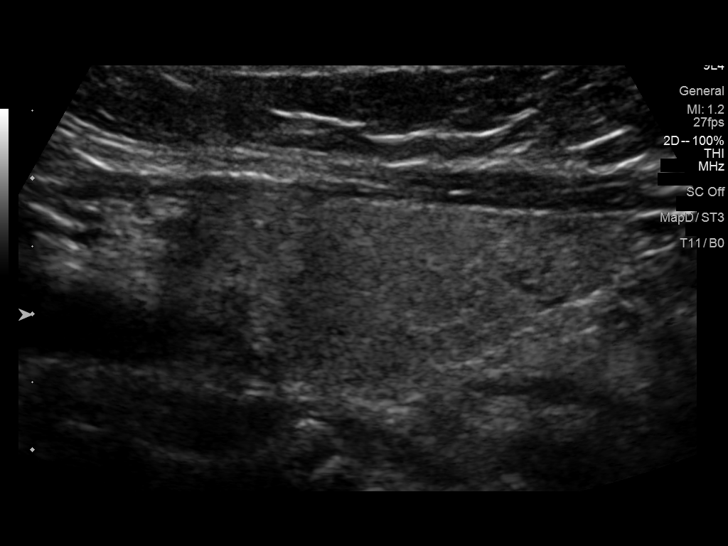
[im 31/37]
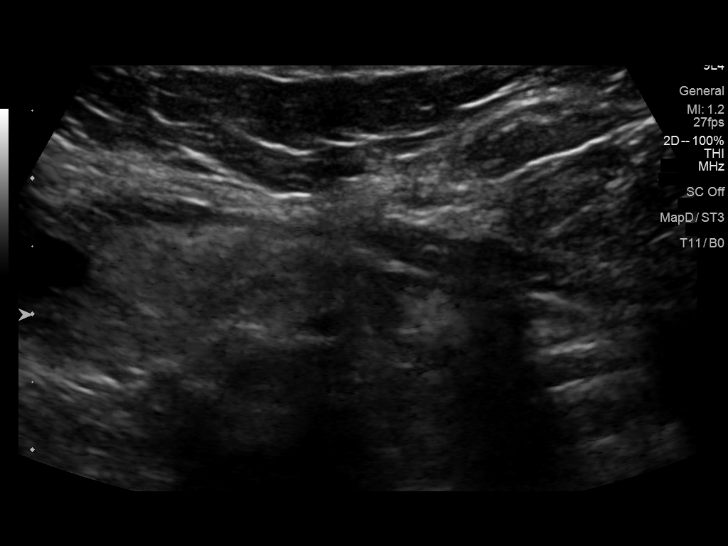
[im 34/37]
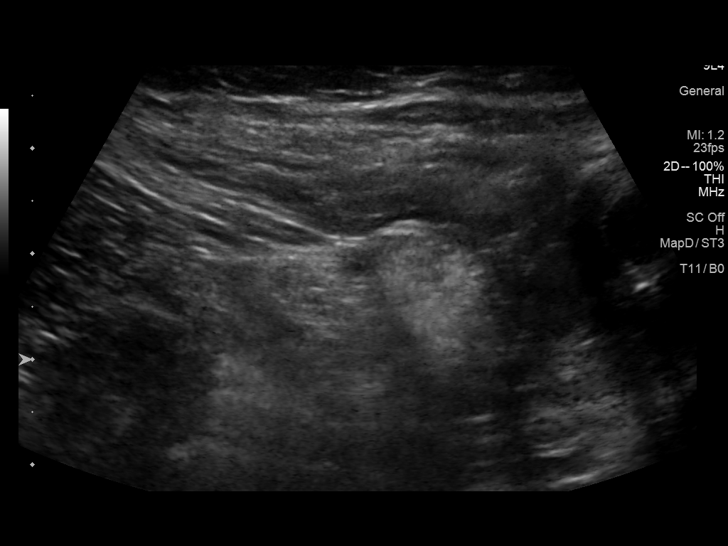
[im 37/37]
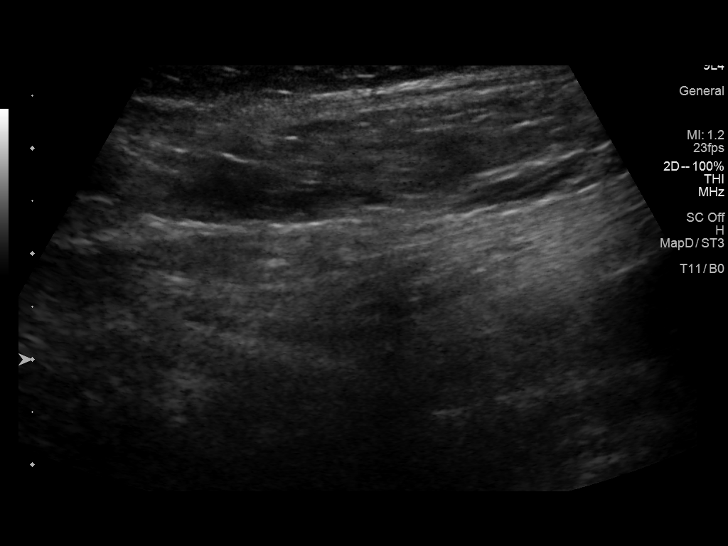

[13 of 25 positions shown; findings below may reference images not displayed]

FINDINGS: Parenchymal Echotexture: Mildly heterogenous

Isthmus: 0.6 cm

Right lobe: 3.8 x 1.4 x 1.8 cm

Left lobe: 4.0 x 1.3 x 1.5 cm

_________________________________________________________

Estimated total number of nodules >/= 1 cm: 3

Number of spongiform nodules >/=  2 cm not described below (TR1): 0

Number of mixed cystic and solid nodules >/= 1.5 cm not described
below (TR2): 0

_________________________________________________________

Nodule 1: Mixed solid cystic nodule in the mid right thyroid lobe
does not meet criteria for FNA or surveillance. I believe this
nodule was labeled as #2 the prior examination and has decreased in
size due to loss of fluid.

_________________________________________________________

Nodule # 2:

Location: Right; inferior

Maximum size: 1.5 cm; Other 2 dimensions: 1.3 x 1.2 cm

Composition: solid/almost completely solid (2)

Echogenicity: hypoechoic (2)

Shape: not taller-than-wide (0)

Margins: ill-defined (0)

Echogenic foci: none (0)

ACR TI-RADS total points: 4.

ACR TI-RADS risk category: TR4 (4-6 points).

ACR TI-RADS recommendations:

**Given size (>/= 1.5 cm) and appearance, fine needle aspiration of
this moderately suspicious nodule should be considered based on
TI-RADS criteria.

_________________________________________________________

Nodule 3: Mixed solid cystic isoechoic 1.1 x 1.1 x 0.6 cm right
inferior thyroid nodule does not meet criteria for imaging
surveillance or FNA. I believe this corresponds to the nodule #1 on
the prior exam. It has increased in size, however predominantly due
to increase in fluid.
IMPRESSION: Nodule 2 (TI-RADS 4) located in the inferior right thyroid lobe
appears to be new compared to the prior examination. This nodule
meets criteria for FNA.

The above is in keeping with the ACR TI-RADS recommendations - [HOSPITAL] 0240;[DATE].

## 2023-04-28 IMAGING — US US FNA BIOPSY THYROID 1ST LESION
1 series · 13 of 15 positions shown · non-contrast
Comparison: US Thyroid 09/29/20

MEDICATIONS:
5 cc 1% lidocaine

COMPLICATIONS:
None immediate.

INDICATION: Right inferior thyroid nodule

1.5 cm
Pt has had at least 2 other biopsies performed on different nodules
in the last several years.
One was done in [place] and one in [REDACTED] per pt.
"Always benign"
EXAM:
ULTRASOUND GUIDED FINE NEEDLE ASPIRATION OF INDETERMINATE THYROID
NODULE
TECHNIQUE: Informed written consent was obtained from the patient after a
discussion of the risks, benefits and alternatives to treatment.
Questions regarding the procedure were encouraged and answered. A
timeout was performed prior to the initiation of the procedure.

[Series 1: us fna biopsy thyroid 1st lesion · 0.05mm/px · 15 acquisitions, 13 frames shown]
[im 1/15]
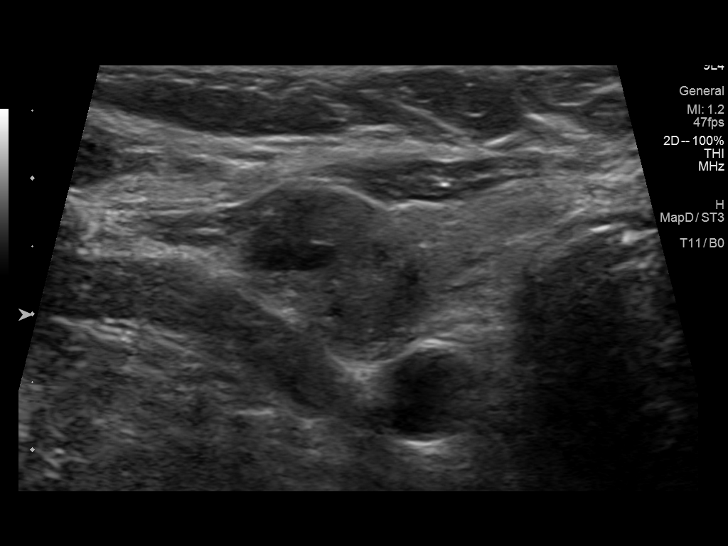
[im 2/15]
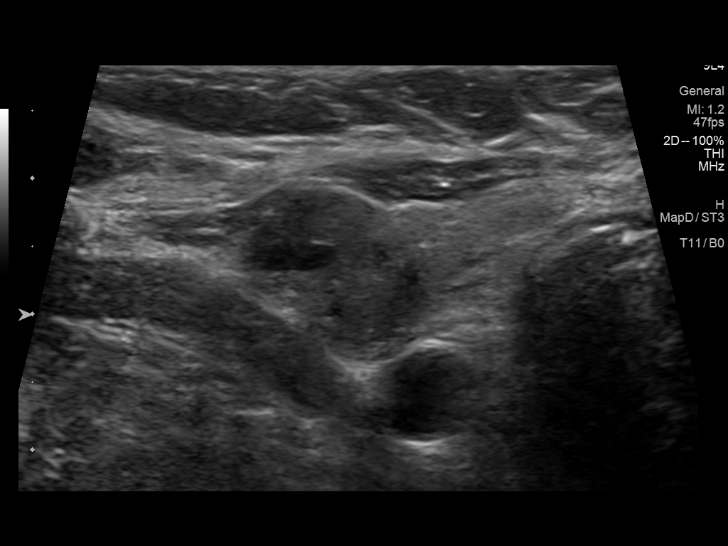
[im 3/15]
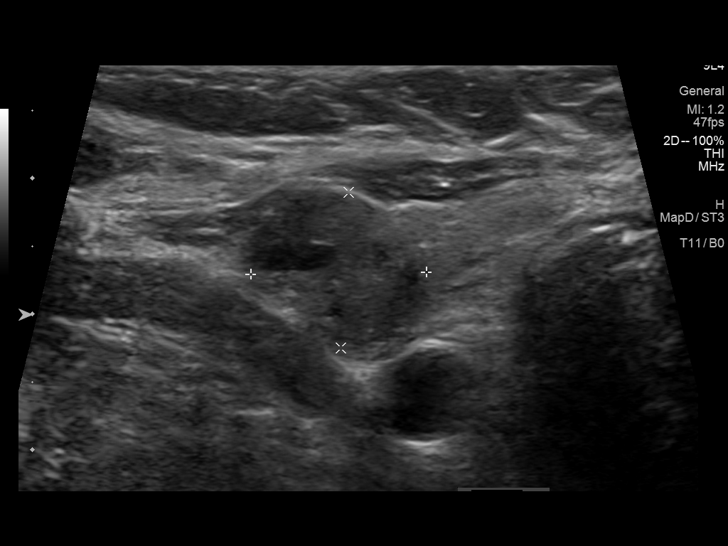
[im 5/15]
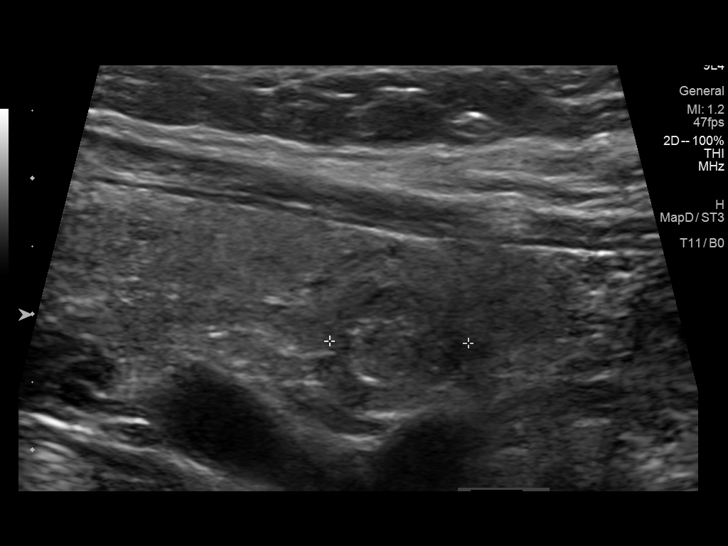
[im 6/15]
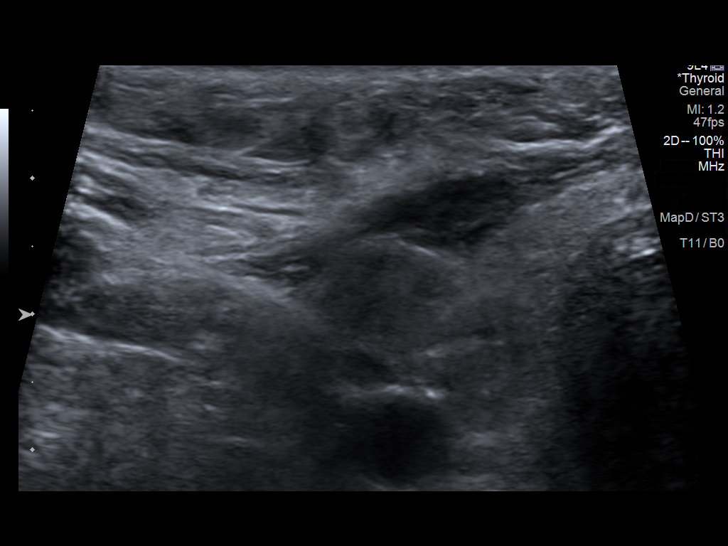
[im 7/15]
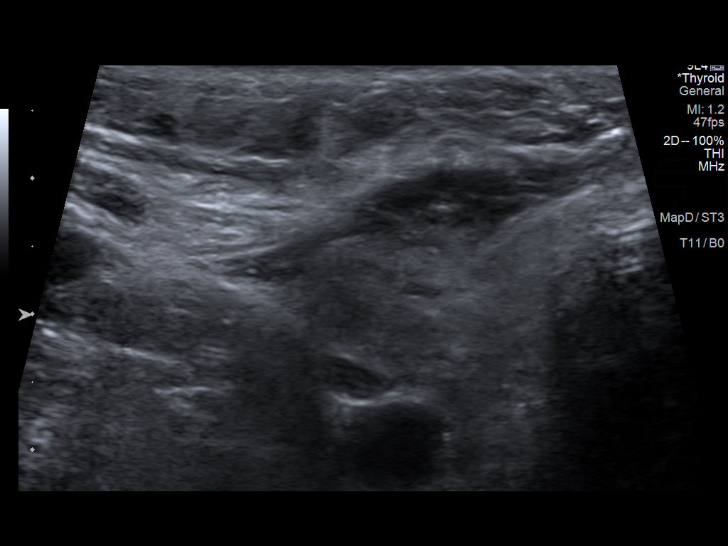
[im 8/15]
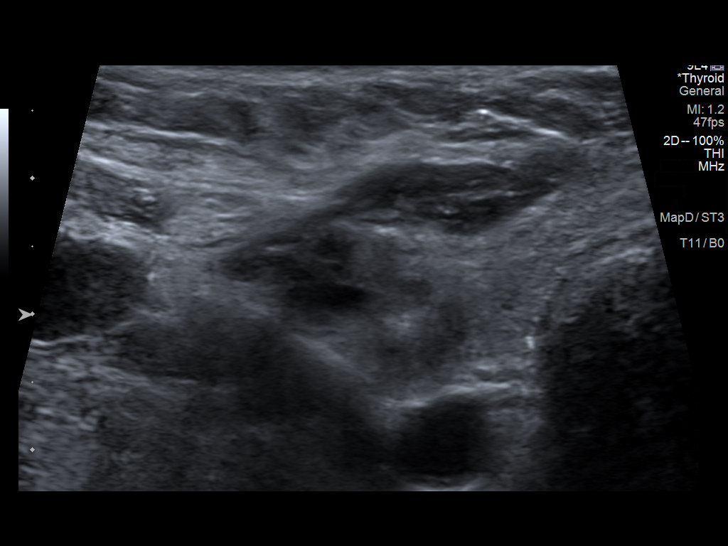
[im 9/15]
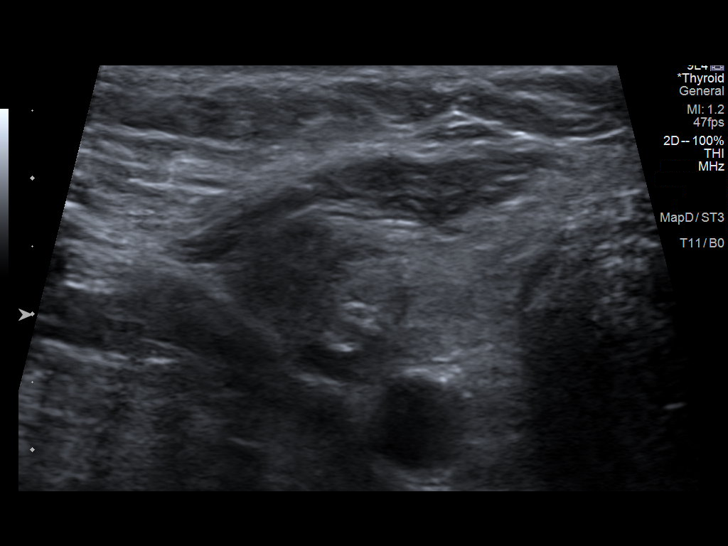
[im 10/15]
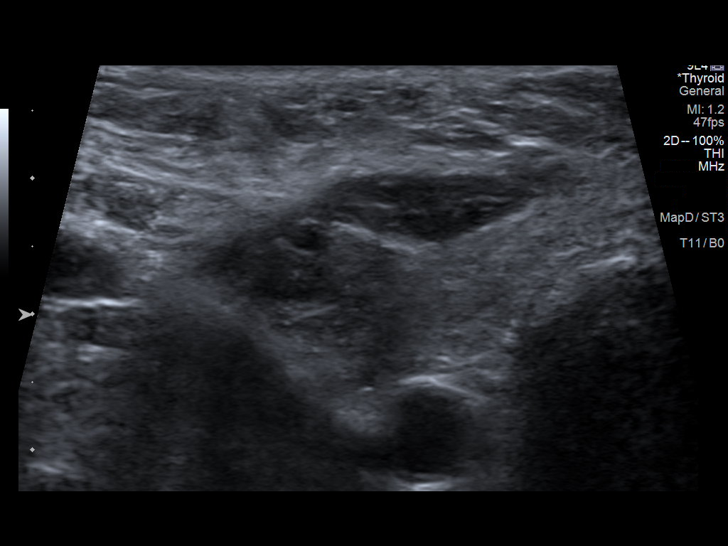
[im 11/15]
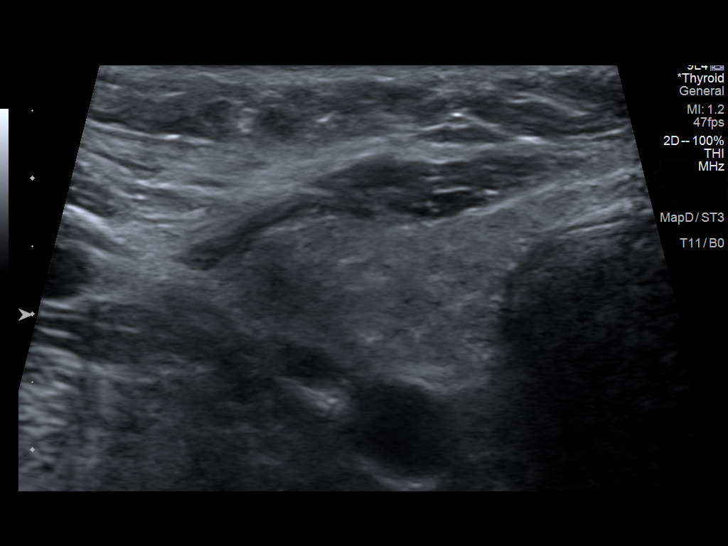
[im 13/15]
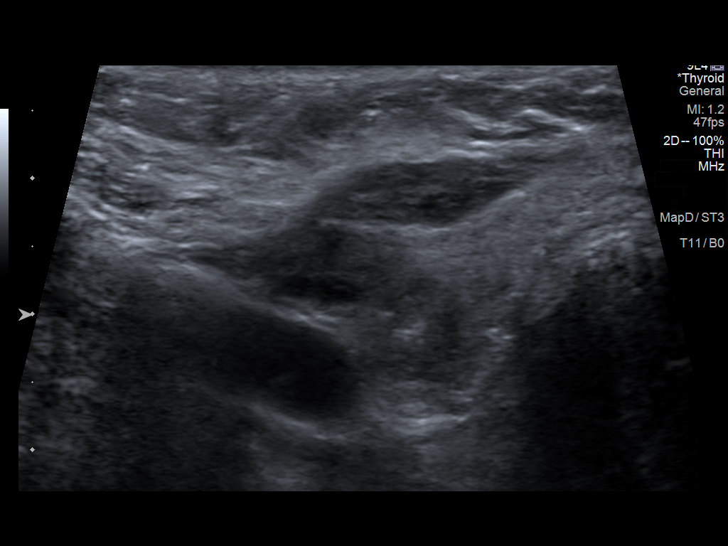
[im 14/15]
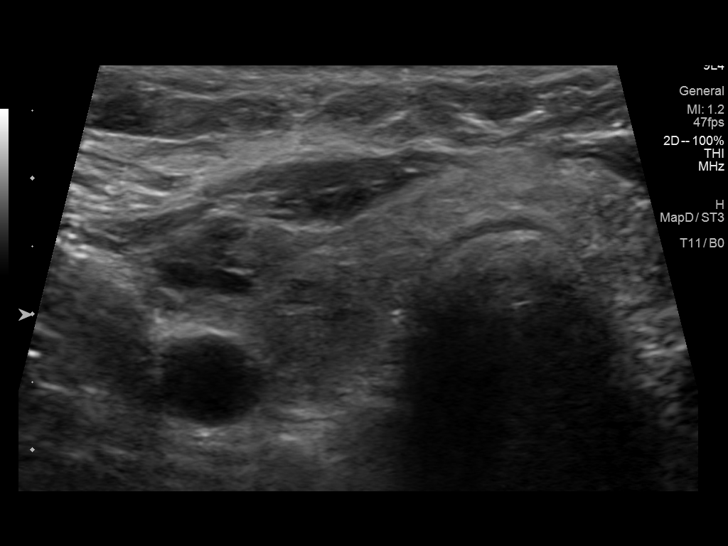
[im 15/15]
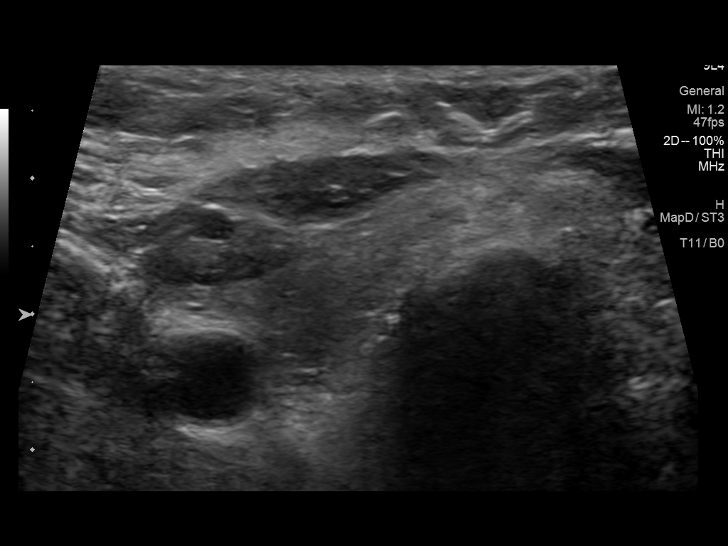

[13 of 15 positions shown; findings below may reference images not displayed]

Pre-procedural ultrasound scanning demonstrated unchanged size and
appearance of the indeterminate nodule within the right thyroid

The procedure was planned. The neck was prepped in the usual sterile
fashion, and a sterile drape was applied covering the operative
field. A timeout was performed prior to the initiation of the
procedure. Local anesthesia was provided with 1% lidocaine.

Under direct ultrasound guidance, 5 FNA biopsies were performed of
the right inferior thyroid nodule with a 27 gauge needle.

2 of these samples were obtained for AFIRMA.

Multiple ultrasound images were saved for procedural documentation
purposes. The samples were prepared and submitted to pathology.

Limited post procedural scanning was negative for hematoma or
additional complication. Dressings were placed. The patient
tolerated the above procedures procedure well without immediate
postprocedural complication.
FINDINGS: Nodule reference number based on prior diagnostic ultrasound: 2

Maximum size: 1.5 cm

Location: Right; Inferior

ACR TI-RADS risk category: TR4 (4-6 points)

Reason for biopsy: meets ACR TI-RADS criteria

Ultrasound imaging confirms appropriate placement of the needles
within the thyroid nodule.
IMPRESSION: Technically successful ultrasound guided fine needle aspiration of
right inferior thyroid nodule

Read by

Malikova Nem
